# Patient Record
Sex: Male | Born: 1990 | Race: Black or African American | Hispanic: No | Marital: Single | Smoking: Current every day smoker
Health system: Southern US, Community
[De-identification: ages and names within clinical notes are randomized; demographics above are authoritative.]

---
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
## 2018-06-28 ENCOUNTER — Encounter: Payer: Self-pay | Admitting: Emergency Medicine

## 2018-06-28 ENCOUNTER — Emergency Department: Payer: No Typology Code available for payment source

## 2018-06-28 ENCOUNTER — Emergency Department
Admission: EM | Admit: 2018-06-28 | Discharge: 2018-06-28 | Disposition: A | Discharge status: Home / Self Care | Payer: No Typology Code available for payment source | Attending: Emergency Medicine | Admitting: Emergency Medicine

## 2018-06-28 ENCOUNTER — Other Ambulatory Visit: Payer: Self-pay

## 2018-06-28 DIAGNOSIS — Y939 Activity, unspecified: Secondary | ICD-10-CM | POA: Diagnosis not present

## 2018-06-28 DIAGNOSIS — Y998 Other external cause status: Secondary | ICD-10-CM | POA: Insufficient documentation

## 2018-06-28 DIAGNOSIS — G44319 Acute post-traumatic headache, not intractable: Secondary | ICD-10-CM | POA: Insufficient documentation

## 2018-06-28 DIAGNOSIS — Y92410 Unspecified street and highway as the place of occurrence of the external cause: Secondary | ICD-10-CM | POA: Insufficient documentation

## 2018-06-28 DIAGNOSIS — S7002XA Contusion of left hip, initial encounter: Secondary | ICD-10-CM | POA: Diagnosis not present

## 2018-06-28 DIAGNOSIS — S161XXA Strain of muscle, fascia and tendon at neck level, initial encounter: Secondary | ICD-10-CM | POA: Diagnosis not present

## 2018-06-28 DIAGNOSIS — S9002XA Contusion of left ankle, initial encounter: Secondary | ICD-10-CM | POA: Diagnosis not present

## 2018-06-28 DIAGNOSIS — S20211A Contusion of right front wall of thorax, initial encounter: Secondary | ICD-10-CM | POA: Insufficient documentation

## 2018-06-28 DIAGNOSIS — S199XXA Unspecified injury of neck, initial encounter: Secondary | ICD-10-CM | POA: Diagnosis present

## 2018-06-28 DIAGNOSIS — S300XXA Contusion of lower back and pelvis, initial encounter: Secondary | ICD-10-CM | POA: Insufficient documentation

## 2018-06-28 DIAGNOSIS — Z23 Encounter for immunization: Secondary | ICD-10-CM | POA: Insufficient documentation

## 2018-06-28 DIAGNOSIS — F17210 Nicotine dependence, cigarettes, uncomplicated: Secondary | ICD-10-CM | POA: Insufficient documentation

## 2018-06-28 DIAGNOSIS — R1030 Lower abdominal pain, unspecified: Secondary | ICD-10-CM | POA: Insufficient documentation

## 2018-06-28 DIAGNOSIS — S63502A Unspecified sprain of left wrist, initial encounter: Secondary | ICD-10-CM

## 2018-06-28 DIAGNOSIS — S8002XA Contusion of left knee, initial encounter: Secondary | ICD-10-CM | POA: Insufficient documentation

## 2018-06-28 LAB — COMPREHENSIVE METABOLIC PANEL
ALBUMIN: 4.4 g/dL (ref 3.5–5.0)
ALT: 15 U/L (ref 0–44)
AST: 31 U/L (ref 15–41)
Alkaline Phosphatase: 56 U/L (ref 38–126)
Anion gap: 9 (ref 5–15)
BUN: 10 mg/dL (ref 6–20)
CHLORIDE: 103 mmol/L (ref 98–111)
CO2: 30 mmol/L (ref 22–32)
CREATININE: 1.2 mg/dL (ref 0.61–1.24)
Calcium: 9.7 mg/dL (ref 8.9–10.3)
GFR calc non Af Amer: >60 mL/min (ref >60)
GLUCOSE: 102 mg/dL — AB (ref 70–99)
Potassium: 3.9 mmol/L (ref 3.5–5.1)
SODIUM: 142 mmol/L (ref 135–145)
Total Bilirubin: 1.3 mg/dL — ABNORMAL HIGH (ref 0.3–1.2)
Total Protein: 7.4 g/dL (ref 6.5–8.1)

## 2018-06-28 LAB — CBC WITH DIFFERENTIAL/PLATELET
Basophils Absolute: 0 10*3/uL (ref 0–0.1)
Basophils Relative: 0 %
EOS ABS: 0 10*3/uL (ref 0–0.7)
Eosinophils Relative: 0 %
HEMATOCRIT: 40.7 % (ref 40.0–52.0)
Hemoglobin: 14.1 g/dL (ref 13.0–18.0)
LYMPHS ABS: 1 10*3/uL (ref 1.0–3.6)
Lymphocytes Relative: 8 %
MCH: 26.3 pg (ref 26.0–34.0)
MCHC: 34.6 g/dL (ref 32.0–36.0)
MCV: 76.1 fL — ABNORMAL LOW (ref 80.0–100.0)
MONO ABS: 0.8 10*3/uL (ref 0.2–1.0)
MONOS PCT: 6 %
NEUTROS ABS: 10.9 10*3/uL — AB (ref 1.4–6.5)
NEUTROS PCT: 86 %
Platelets: 192 10*3/uL (ref 150–440)
RBC: 5.35 MIL/uL (ref 4.40–5.90)
RDW: 13.9 % (ref 11.5–14.5)
WBC: 12.7 10*3/uL — ABNORMAL HIGH (ref 3.8–10.6)

## 2018-06-28 MED ORDER — MORPHINE SULFATE (PF) 4 MG/ML IV SOLN
4.0000 mg | Freq: Once | INTRAVENOUS | Status: AC
  Administered 2018-06-28: 4 mg via INTRAVENOUS
  Filled 2018-06-28: qty 1

## 2018-06-28 MED ORDER — METHOCARBAMOL 500 MG PO TABS: 500.0000 mg | ORAL_TABLET | Freq: Four times a day (QID) | ORAL | 0 refills | Status: AC

## 2018-06-28 MED ORDER — HYDROCODONE-ACETAMINOPHEN 5-325 MG PO TABS: 1.0000 | ORAL_TABLET | ORAL | 0 refills | Status: AC | PRN

## 2018-06-28 MED ORDER — TETANUS-DIPHTH-ACELL PERTUSSIS 5-2.5-18.5 LF-MCG/0.5 IM SUSP
0.5000 mL | Freq: Once | INTRAMUSCULAR | Status: AC
  Administered 2018-06-28: 0.5 mL via INTRAMUSCULAR
  Filled 2018-06-28: qty 0.5

## 2018-06-28 MED ORDER — MELOXICAM 15 MG PO TABS: 15.0000 mg | ORAL_TABLET | Freq: Every day | ORAL | 0 refills | Status: AC

## 2018-06-28 MED ORDER — SODIUM CHLORIDE 0.9 % IV BOLUS
1000.0000 mL | Freq: Once | INTRAVENOUS | Status: AC
  Administered 2018-06-28: 1000 mL via INTRAVENOUS

## 2018-06-28 MED ORDER — ONDANSETRON HCL 4 MG/2ML IJ SOLN
4.0000 mg | Freq: Once | INTRAMUSCULAR | Status: AC
  Administered 2018-06-28: 4 mg via INTRAVENOUS
  Filled 2018-06-28: qty 2

## 2018-06-28 MED ORDER — IOHEXOL 300 MG/ML  SOLN
100.0000 mL | Freq: Once | INTRAMUSCULAR | Status: AC | PRN
  Administered 2018-06-28: 100 mL via INTRAVENOUS
  Filled 2018-06-28: qty 100

## 2018-06-28 MED ORDER — CEPHALEXIN 500 MG PO CAPS: 1000.0000 mg | ORAL_CAPSULE | Freq: Two times a day (BID) | ORAL | 0 refills | Status: DC
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
## 2018-07-08 ENCOUNTER — Emergency Department
Admission: EM | Admit: 2018-07-08 | Discharge: 2018-07-08 | Disposition: A | Discharge status: Home / Self Care | Payer: No Typology Code available for payment source | Attending: Emergency Medicine | Admitting: Emergency Medicine

## 2018-07-08 ENCOUNTER — Encounter: Payer: Self-pay | Admitting: Emergency Medicine

## 2018-07-08 ENCOUNTER — Emergency Department: Payer: No Typology Code available for payment source

## 2018-07-08 DIAGNOSIS — T07XXXA Unspecified multiple injuries, initial encounter: Secondary | ICD-10-CM

## 2018-07-08 DIAGNOSIS — T07XXXD Unspecified multiple injuries, subsequent encounter: Secondary | ICD-10-CM | POA: Diagnosis not present

## 2018-07-08 DIAGNOSIS — F17210 Nicotine dependence, cigarettes, uncomplicated: Secondary | ICD-10-CM | POA: Diagnosis not present

## 2018-07-08 DIAGNOSIS — M79606 Pain in leg, unspecified: Secondary | ICD-10-CM | POA: Diagnosis present

## 2018-07-08 MED ORDER — KETOROLAC TROMETHAMINE 30 MG/ML IJ SOLN
30.0000 mg | Freq: Once | INTRAMUSCULAR | Status: AC
  Administered 2018-07-08: 30 mg via INTRAMUSCULAR
  Filled 2018-07-08: qty 1
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
## 2018-07-08 NOTE — Discharge Instructions (Addendum)
[facility] if any continued problems.  Continue taking meloxicam 15 mg 1 daily with food.  You may use ice or heat to your muscles as needed for discomfort.  X-rays were reassuring today that no bony injury was seen.
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
